# Patient Record
Sex: Female | Born: 1985 | Race: Black or African American | Hispanic: No | Marital: Single | Smoking: Never smoker
Health system: Southern US, Community
[De-identification: ages and names within clinical notes are randomized; demographics above are authoritative.]

---

## 2009-07-12 ENCOUNTER — Emergency Department (HOSPITAL_COMMUNITY): Admission: EM | Admit: 2009-07-12 | Discharge: 2009-07-12 | Payer: Self-pay | Admitting: Family Medicine

## 2009-12-19 ENCOUNTER — Emergency Department (HOSPITAL_COMMUNITY): Admission: EM | Admit: 2009-12-19 | Discharge: 2009-12-19 | Payer: Self-pay | Admitting: Family Medicine

## 2010-12-13 LAB — POCT INFECTIOUS MONO SCREEN: Mono Screen: NEGATIVE

## 2010-12-13 LAB — POCT RAPID STREP A (OFFICE): Streptococcus, Group A Screen (Direct): NEGATIVE

## 2011-10-30 IMAGING — CR DG FINGER THUMB 2+V*L*
1 series · 1 of 1 positions shown · non-contrast
Comparison: None

CLINICAL DATA: Trauma to the distal thumb

LEFT THUMB 2+V

[view not recorded]
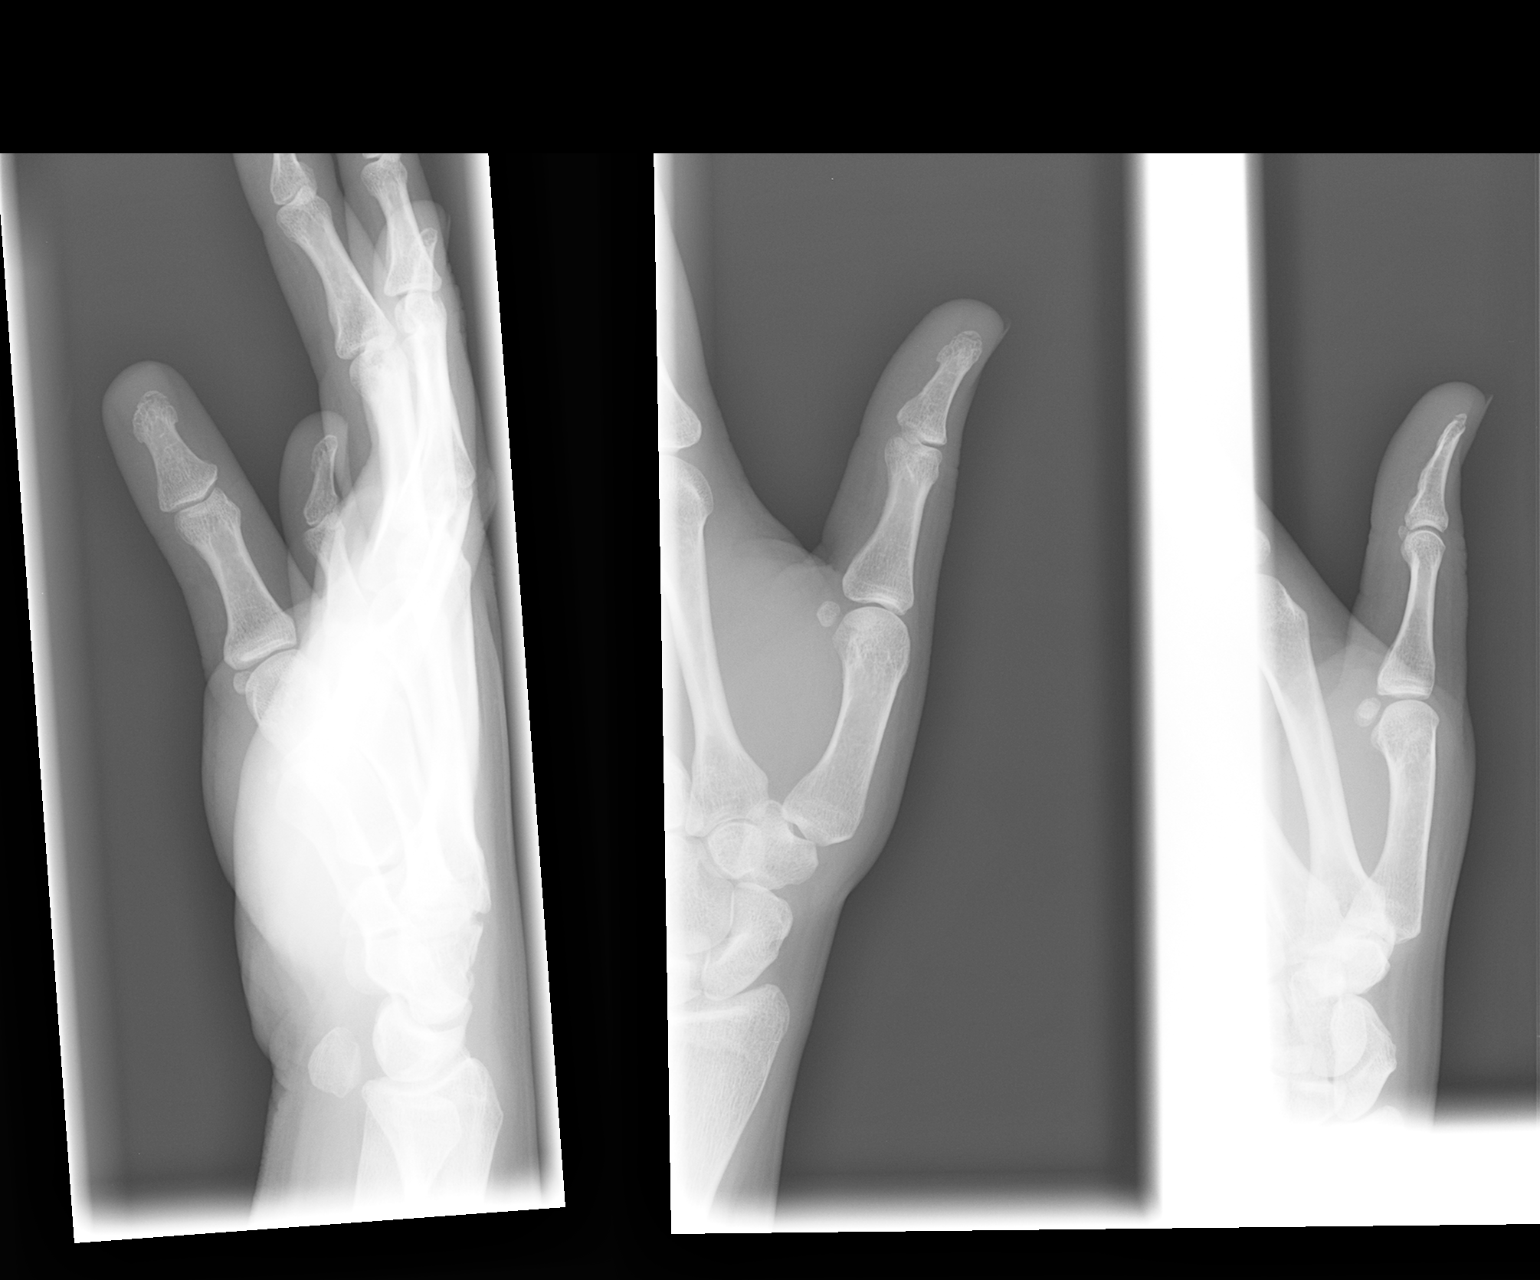

[1 of 1 positions shown; findings below may reference images not displayed]

FINDINGS: There is a tuft fracture of the left thumb.  No
radiopaque foreign body.  No soft tissue abnormality.
IMPRESSION: Tuft fracture of the left thumb.

## 2013-06-15 ENCOUNTER — Ambulatory Visit: Payer: Self-pay

## 2013-07-09 ENCOUNTER — Ambulatory Visit: Payer: Self-pay | Attending: Internal Medicine | Admitting: Internal Medicine

## 2013-07-09 VITALS — BP 121/82 | HR 86 | Temp 98.9°F | Resp 16 | Ht 68.0 in | Wt 227.0 lb

## 2013-07-09 DIAGNOSIS — D1720 Benign lipomatous neoplasm of skin and subcutaneous tissue of unspecified limb: Secondary | ICD-10-CM | POA: Insufficient documentation

## 2013-07-09 DIAGNOSIS — Z0000 Encounter for general adult medical examination without abnormal findings: Secondary | ICD-10-CM

## 2013-07-09 LAB — COMPREHENSIVE METABOLIC PANEL
ALT: 8 U/L (ref 0–35)
Albumin: 4.3 g/dL (ref 3.5–5.2)
CO2: 23 mEq/L (ref 19–32)
Calcium: 9.9 mg/dL (ref 8.4–10.5)
Chloride: 106 mEq/L (ref 96–112)
Glucose, Bld: 93 mg/dL (ref 70–99)
Total Bilirubin: 0.4 mg/dL (ref 0.3–1.2)

## 2013-07-09 LAB — CBC WITH DIFFERENTIAL/PLATELET
Basophils Relative: 1 % (ref 0–1)
Eosinophils Relative: 1 % (ref 0–5)
MCH: 31.9 pg (ref 26.0–34.0)
Monocytes Absolute: 0.5 10*3/uL (ref 0.1–1.0)
Monocytes Relative: 9 % (ref 3–12)
Neutro Abs: 2.8 10*3/uL (ref 1.7–7.7)
Neutrophils Relative %: 53 % (ref 43–77)
Platelets: 294 10*3/uL (ref 150–400)
RDW: 13.2 % (ref 11.5–15.5)

## 2013-07-09 LAB — LIPID PANEL
Cholesterol: 177 mg/dL (ref 0–200)
Total CHOL/HDL Ratio: 4.1 Ratio

## 2013-07-09 NOTE — Progress Notes (Unsigned)
Pt is here today to establish care. Pt is requesting a physical. The only C.C. It that she has a fatty lump under her left arm pitt that has been there for over eight years.

## 2013-07-09 NOTE — Progress Notes (Unsigned)
Patient ID: Lori Nolan, female   DOB: [birth year], 27 y.o.   MRN: [id number] Patient Demographics  Lori Nolan, is a 27 y.o. female  [id number]  [id number]  DOB - 28-Jan-1986  Chief Complaint  Patient presents with  . Establish Care        Subjective:   Lori Nolan today is here to establish primary care. Patient is a 27 year old female with no significant medical history, presented to establish care She states that she is having no acute medical issues except he has the fatty lump under her left armpit which has been there for several years (at least 8 years). Patient's mother has history of breast cancer, finishing chemotherapy. Hence patient was concerned about the mass.  Patient has No headache, No chest pain, No abdominal pain - No Nausea, No new weakness tingling or numbness, No Cough - SOB.  Objective:    Filed Vitals:   07/09/13 1546  BP: 121/82  Pulse: 86  Temp: 98.9 F (37.2 C)  TempSrc: Oral  Resp: 16  Height: 5\' 8"  (1.727 m)  Weight: 227 lb (102.967 kg)  SpO2: 97%     ALLERGIES:  No Known Allergies  PAST MEDICAL HISTORY: History reviewed. No pertinent past medical history.  PAST SURGICAL HISTORY: History reviewed. No pertinent past surgical history.  FAMILY HISTORY: Family History  Problem Relation Age of Onset  . Alcohol abuse Father   . Diabetes Sister   . Cancer Mother     MEDICATIONS AT HOME: Prior to Admission medications   Not on File    REVIEW OF SYSTEMS:  Constitutional:   No   Fevers, chills, fatigue.  HEENT:    No headaches, Sore throat,   Cardio-vascular: No chest pain,  Orthopnea, swelling in lower extremities, anasarca, palpitations  GI:  No abdominal pain, nausea, vomiting, diarrhea  Resp: No shortness of breath,  No coughing up of blood.No cough.No wheezing.  Skin:  no rash or lesions.  GU:  no dysuria, change in color of urine, no urgency or frequency.  No flank pain.  Musculoskeletal: No  joint pain or swelling.  No decreased range of motion.  No back pain.  Psych: No change in mood or affect. No depression or anxiety.  No memory loss.   Exam  General appearance :Awake, alert, NAD, Speech Clear. HEENT: Atraumatic and Normocephalic, PERLA Neck: supple, no JVD. No cervical lymphadenopathy.  Chest: clear to auscultation bilaterally, no wheezing, rales or rhonchi Fatty lipoma like mass under left axilla CVS: S1 S2 regular, no murmurs.  Abdomen: soft, NBS, NT, ND, no gaurding, rigidity or rebound. Extremities: No cyanosis, clubbing, B/L Lower Ext shows no edema,  Neurology: Awake alert, and oriented X 3, CN II-XII intact, Non focal Skin:No Rash or lesions Wounds: N/A    Data Review   Basic Metabolic Panel: No results found for this basename: NA, K, CL, CO2, GLUCOSE, BUN, CREATININE, CALCIUM, MG, PHOS,  in the last 168 hours Liver Function Tests: No results found for this basename: AST, ALT, ALKPHOS, BILITOT, PROT, ALBUMIN,  in the last 168 hours  CBC: No results found for this basename: WBC, NEUTROABS, HGB, HCT, MCV, PLT,  in the last 168 hours ------------------------------------------------------------------------------------------------------------------ No results found for this basename: HGBA1C,  in the last 72 hours ------------------------------------------------------------------------------------------------------------------ No results found for this basename: CHOL, HDL, LDLCALC, TRIG, CHOLHDL, LDLDIRECT,  in the last 72 hours ------------------------------------------------------------------------------------------------------------------ No results found for this basename: TSH, T4TOTAL, FREET3, T3FREE, THYROIDAB,  in the last 72 hours ------------------------------------------------------------------------------------------------------------------ No results  found for this basename: VITAMINB12, FOLATE, FERRITIN, TIBC, IRON, RETICCTPCT,  in the last 72  hours  Coagulation profile  No results found for this basename: INR, PROTIME,  in the last 168 hours    Assessment & Plan   Active Problems: Health screening: - CBC, BMET, LIPID PANEL ordered  Fatty Mass in the left axilla: Possibly a lipoma - Will get CT of the chest including the axilla region to rule out any lymphadenopathy or mass, it appears to be lipoma on examination  Recommendations: Follow in 3 months. Patient will be called with CT results if abnormal   Follow-up in 3months   Adaisha Campise M.D. 07/09/2013, 3:55 PM

## 2013-07-23 ENCOUNTER — Ambulatory Visit (HOSPITAL_COMMUNITY): Payer: BC Managed Care – PPO

## 2013-10-09 ENCOUNTER — Encounter: Payer: Self-pay | Admitting: Internal Medicine

## 2013-10-09 ENCOUNTER — Ambulatory Visit: Payer: BC Managed Care – PPO | Attending: Internal Medicine | Admitting: Internal Medicine

## 2013-10-09 VITALS — BP 138/83 | HR 84 | Temp 99.1°F | Resp 16 | Ht 68.0 in | Wt 230.0 lb

## 2013-10-09 DIAGNOSIS — N946 Dysmenorrhea, unspecified: Secondary | ICD-10-CM

## 2013-10-09 DIAGNOSIS — Z09 Encounter for follow-up examination after completed treatment for conditions other than malignant neoplasm: Secondary | ICD-10-CM

## 2013-10-09 DIAGNOSIS — D1739 Benign lipomatous neoplasm of skin and subcutaneous tissue of other sites: Secondary | ICD-10-CM

## 2013-10-09 DIAGNOSIS — D1720 Benign lipomatous neoplasm of skin and subcutaneous tissue of unspecified limb: Secondary | ICD-10-CM

## 2013-10-09 DIAGNOSIS — D179 Benign lipomatous neoplasm, unspecified: Secondary | ICD-10-CM | POA: Insufficient documentation

## 2013-10-09 NOTE — Progress Notes (Signed)
MRN: [id number] Name: Lori Nolan  Sex: female Age: 28 y.o. DOB: 20-Aug-1986  Allergies: Review of patient's allergies indicates no known allergies.  Chief Complaint  Patient presents with  . Follow-up    HPI: Patient is 28 y.o. female who comes today for followup, she was seen by Dr. Tana Coast on the last visit patient has excessive fatty tissue in the left axilla and had a CAT scan ordered which patient has not been able to get done yet denies any change in symptoms, she also has lot of natural cramps and she has taken ibuprofen which helps, has not been followed with gynecologist and not done Pap smear yet.  History reviewed. No pertinent past medical history.  History reviewed. No pertinent past surgical history.    Medication List    Notice As of 10/09/2013  4:23 PM   You have not been prescribed any medications.      No orders of the defined types were placed in this encounter.     There is no immunization history on file for this patient.  Family History  Problem Relation Age of Onset  . Alcohol abuse Father   . Diabetes Sister   . Cancer Mother     History  Substance Use Topics  . Smoking status: Never Smoker   . Smokeless tobacco: Not on file  . Alcohol Use: No    Review of Systems  As noted in HPI  Filed Vitals:   10/09/13 1604  BP: 138/83  Pulse: 84  Temp: 99.1 F (37.3 C)  Resp: 16    Physical Exam  Physical Exam  Eyes: EOM are normal. Pupils are equal, round, and reactive to light.  Cardiovascular: Normal rate and regular rhythm.   Pulmonary/Chest: Breath sounds normal. No respiratory distress. She has no wheezes. She has no rales.  Musculoskeletal:  Excessive ? Fatty growth in left axilla    CBC    Component Value Date/Time   WBC 5.3 07/09/2013 1601   RBC 4.57 07/09/2013 1601   HGB 14.6 07/09/2013 1601   HCT 40.8 07/09/2013 1601   PLT 294 07/09/2013 1601   MCV 89.3 07/09/2013 1601   LYMPHSABS 1.9 07/09/2013 1601   MONOABS 0.5  07/09/2013 1601   EOSABS 0.0 07/09/2013 1601   BASOSABS 0.0 07/09/2013 1601    CMP     Component Value Date/Time   NA 138 07/09/2013 1601   K 3.9 07/09/2013 1601   CL 106 07/09/2013 1601   CO2 23 07/09/2013 1601   GLUCOSE 93 07/09/2013 1601   BUN 10 07/09/2013 1601   CREATININE 0.98 07/09/2013 1601   CALCIUM 9.9 07/09/2013 1601   PROT 7.7 07/09/2013 1601   ALBUMIN 4.3 07/09/2013 1601   AST 12 07/09/2013 1601   ALT 8 07/09/2013 1601   ALKPHOS 54 07/09/2013 1601   BILITOT 0.4 07/09/2013 1601    Lab Results  Component Value Date/Time   CHOL 177 07/09/2013  4:01 PM    No components found with this basename: hga1c    Lab Results  Component Value Date/Time   AST 12 07/09/2013  4:01 PM    Assessment and Plan  Follow up  Menstrual cramps - Plan: Ibuprofen when necessary and also referred. Ambulatory referral to Gynecology  Lipoma of axilla Patient is to do a CAT scan.  Health Maintenance -Pap Smear: referral done    Return in about 6 months (around 04/08/2014), or if symptoms worsen or fail to improve.  Lorayne Marek, MD

## 2013-10-09 NOTE — Progress Notes (Signed)
Pt was unable to get CT due to finances. Pt reports having extreme cramps and aching all over her body which is causing her to call out of work.

## 2013-10-21 ENCOUNTER — Encounter: Payer: Self-pay | Admitting: Obstetrics and Gynecology

## 2013-12-02 ENCOUNTER — Ambulatory Visit (INDEPENDENT_AMBULATORY_CARE_PROVIDER_SITE_OTHER): Payer: BC Managed Care – PPO | Admitting: Obstetrics & Gynecology

## 2013-12-02 ENCOUNTER — Encounter: Payer: Self-pay | Admitting: Obstetrics & Gynecology

## 2013-12-02 VITALS — BP 127/77 | HR 80 | Temp 98.2°F | Resp 20 | Ht 68.0 in | Wt 227.7 lb

## 2013-12-02 DIAGNOSIS — Z01419 Encounter for gynecological examination (general) (routine) without abnormal findings: Secondary | ICD-10-CM

## 2013-12-02 DIAGNOSIS — E669 Obesity, unspecified: Secondary | ICD-10-CM

## 2013-12-02 MED ORDER — IBUPROFEN 800 MG PO TABS: 800.0000 mg | ORAL_TABLET | Freq: Three times a day (TID) | ORAL | Status: DC | PRN

## 2013-12-02 NOTE — Progress Notes (Signed)
Subjective:    Lori Nolan is a 28 y.o. S AA G27 female who presents for an annual exam. The patient has no complaints today. She mentions that for the last 3-4 months she has been having worsening menstrual cramps. She sometimes has to call out of work due to the cramps. She has taken Tylenol and 400mg  of IBU q 4-6 hours. This does help. The patient has never been sexually active. GYN screening history: no prior history of gyn screening tests. The patient wears seatbelts: yes. The patient participates in regular exercise: no. Has the patient ever been transfused or tattooed?: no. The patient reports that there is not domestic violence in her life.   Menstrual History: OB History   Grav Para Term Preterm Abortions TAB SAB Ect Mult Living                  Menarche age: 61  Patient's last menstrual period was 11/09/2013.    The following portions of the patient's history were reviewed and updated as appropriate: allergies, current medications, past family history, past medical history, past social history, past surgical history and problem list.  Review of Systems A comprehensive review of systems was negative. She is a Occupational psychologist at [employer]. She got a flu vaccine this season.   Objective:    BP 127/77  Pulse 80  Temp(Src) 98.2 F (36.8 C) (Oral)  Resp 20  Ht 5\' 8"  (1.727 m)  Wt 227 lb 11.2 oz (103.284 kg)  BMI 34.63 kg/m2  LMP 11/09/2013  General Appearance:    Alert, cooperative, no distress, appears stated age  Head:    Normocephalic, without obvious abnormality, atraumatic  Eyes:    PERRL, conjunctiva/corneas clear, EOM's intact, fundi    benign, both eyes  Ears:    Normal TM's and external ear canals, both ears  Nose:   Nares normal, septum midline, mucosa normal, no drainage    or sinus tenderness  Throat:   Lips, mucosa, and tongue normal; teeth and gums normal  Neck:   Supple, symmetrical, trachea midline, no adenopathy;    thyroid:  no  enlargement/tenderness/nodules; no carotid   bruit or JVD  Back:     Symmetric, no curvature, ROM normal, no CVA tenderness  Lungs:     Clear to auscultation bilaterally, respirations unlabored  Chest Wall:    No tenderness or deformity   Heart:    Regular rate and rhythm, S1 and S2 normal, no murmur, rub   or gallop  Breast Exam:    No tenderness, masses, or nipple abnormality  Abdomen:     Soft, non-tender, bowel sounds active all four quadrants,    no masses, no organomegaly  Genitalia:    Normal female without lesion, discharge or tenderness     Extremities:   Extremities normal, atraumatic, no cyanosis or edema  Pulses:   2+ and symmetric all extremities  Skin:   Skin color, texture, turgor normal, no rashes or lesions  Lymph nodes:   Cervical, supraclavicular, and axillary nodes normal  Neurologic:   CNII-XII intact, normal strength, sensation and reflexes    throughout  .    Assessment:    Healthy female exam.  Dysmenorrhea- IBU 800 mg TID/QID   Plan:     Breast self exam technique reviewed and patient encouraged to perform self-exam monthly. Thin prep Pap smear.  IBU 800 mg prn. I did offer OCPs but she will use these if the IBU doesn't work

## 2013-12-02 NOTE — Patient Instructions (Signed)

## 2015-10-04 ENCOUNTER — Other Ambulatory Visit: Payer: Self-pay | Admitting: Obstetrics & Gynecology

## 2018-06-27 ENCOUNTER — Telehealth: Payer: Self-pay

## 2018-06-27 NOTE — Telephone Encounter (Signed)
Opened in error

## 2019-05-28 ENCOUNTER — Other Ambulatory Visit: Payer: Self-pay

## 2019-05-28 DIAGNOSIS — Z20822 Contact with and (suspected) exposure to covid-19: Secondary | ICD-10-CM

## 2019-05-30 LAB — NOVEL CORONAVIRUS, NAA: SARS-CoV-2, NAA: NOT DETECTED

## 2019-10-12 ENCOUNTER — Ambulatory Visit: Payer: 59 | Attending: Internal Medicine

## 2019-10-12 DIAGNOSIS — Z20822 Contact with and (suspected) exposure to covid-19: Secondary | ICD-10-CM

## 2019-10-13 LAB — NOVEL CORONAVIRUS, NAA: SARS-CoV-2, NAA: NOT DETECTED
